# Patient Record
Sex: Female | Born: 1940 | Race: Black or African American | Hispanic: No | Marital: Married
Health system: Southern US, Community
[De-identification: ages and names within clinical notes are randomized; demographics above are authoritative.]

---

## 1998-05-22 ENCOUNTER — Ambulatory Visit (HOSPITAL_COMMUNITY): Admission: RE | Admit: 1998-05-22 | Discharge: 1998-05-22 | Payer: Self-pay

## 1998-07-31 ENCOUNTER — Inpatient Hospital Stay (HOSPITAL_COMMUNITY): Admission: RE | Admit: 1998-07-31 | Discharge: 1998-08-01 | Payer: Self-pay | Admitting: Neurosurgery

## 1998-08-24 ENCOUNTER — Emergency Department (HOSPITAL_COMMUNITY): Admission: EM | Admit: 1998-08-24 | Discharge: 1998-08-24 | Payer: Self-pay | Admitting: Emergency Medicine

## 1998-08-26 ENCOUNTER — Encounter: Payer: Self-pay | Admitting: Neurosurgery

## 1998-08-26 ENCOUNTER — Inpatient Hospital Stay (HOSPITAL_COMMUNITY): Admission: AD | Admit: 1998-08-26 | Discharge: 1998-08-28 | Payer: Self-pay | Admitting: Neurosurgery

## 1998-10-10 ENCOUNTER — Emergency Department (HOSPITAL_COMMUNITY): Admission: EM | Admit: 1998-10-10 | Discharge: 1998-10-10 | Payer: Self-pay | Admitting: Emergency Medicine

## 1998-10-10 ENCOUNTER — Encounter: Payer: Self-pay | Admitting: Emergency Medicine

## 1999-10-09 ENCOUNTER — Encounter: Admission: RE | Admit: 1999-10-09 | Discharge: 1999-10-09 | Payer: Self-pay

## 2000-02-11 ENCOUNTER — Encounter: Payer: Self-pay | Admitting: Neurosurgery

## 2000-02-11 ENCOUNTER — Encounter: Admission: RE | Admit: 2000-02-11 | Discharge: 2000-02-11 | Payer: Self-pay | Admitting: Neurosurgery

## 2000-02-24 ENCOUNTER — Encounter: Payer: Self-pay | Admitting: Internal Medicine

## 2000-02-24 ENCOUNTER — Encounter: Admission: RE | Admit: 2000-02-24 | Discharge: 2000-02-24 | Payer: Self-pay | Admitting: Internal Medicine

## 2000-03-03 ENCOUNTER — Encounter: Admission: RE | Admit: 2000-03-03 | Discharge: 2000-03-03 | Payer: Self-pay | Admitting: Internal Medicine

## 2000-03-03 ENCOUNTER — Encounter: Payer: Self-pay | Admitting: Internal Medicine

## 2000-03-11 ENCOUNTER — Encounter: Payer: Self-pay | Admitting: General Surgery

## 2000-03-11 ENCOUNTER — Encounter: Admission: RE | Admit: 2000-03-11 | Discharge: 2000-03-11 | Payer: Self-pay | Admitting: General Surgery

## 2000-03-15 ENCOUNTER — Encounter (INDEPENDENT_AMBULATORY_CARE_PROVIDER_SITE_OTHER): Payer: Self-pay | Admitting: *Deleted

## 2000-03-15 ENCOUNTER — Ambulatory Visit (HOSPITAL_BASED_OUTPATIENT_CLINIC_OR_DEPARTMENT_OTHER): Admission: RE | Admit: 2000-03-15 | Discharge: 2000-03-15 | Payer: Self-pay

## 2000-04-15 ENCOUNTER — Encounter: Payer: Self-pay | Admitting: Neurosurgery

## 2000-04-15 ENCOUNTER — Inpatient Hospital Stay (HOSPITAL_COMMUNITY): Admission: RE | Admit: 2000-04-15 | Discharge: 2000-04-18 | Payer: Self-pay | Admitting: Neurosurgery

## 2000-04-16 ENCOUNTER — Encounter: Payer: Self-pay | Admitting: Internal Medicine

## 2000-07-19 ENCOUNTER — Encounter: Admission: RE | Admit: 2000-07-19 | Discharge: 2000-07-19 | Payer: Self-pay | Admitting: Neurosurgery

## 2000-07-19 ENCOUNTER — Encounter: Payer: Self-pay | Admitting: Neurosurgery

## 2000-11-03 ENCOUNTER — Encounter: Payer: Self-pay | Admitting: Neurosurgery

## 2000-11-03 ENCOUNTER — Encounter: Admission: RE | Admit: 2000-11-03 | Discharge: 2000-11-03 | Payer: Self-pay | Admitting: Neurosurgery

## 2001-04-07 ENCOUNTER — Ambulatory Visit (HOSPITAL_COMMUNITY): Admission: RE | Admit: 2001-04-07 | Discharge: 2001-04-07 | Payer: Self-pay | Admitting: Gastroenterology

## 2001-04-25 ENCOUNTER — Encounter: Payer: Self-pay | Admitting: Neurosurgery

## 2001-04-25 ENCOUNTER — Encounter: Admission: RE | Admit: 2001-04-25 | Discharge: 2001-04-25 | Payer: Self-pay | Admitting: Neurosurgery

## 2001-08-01 ENCOUNTER — Encounter: Payer: Self-pay | Admitting: Internal Medicine

## 2001-08-01 ENCOUNTER — Encounter: Admission: RE | Admit: 2001-08-01 | Discharge: 2001-08-01 | Payer: Self-pay | Admitting: Internal Medicine

## 2001-10-10 ENCOUNTER — Observation Stay (HOSPITAL_COMMUNITY): Admission: EM | Admit: 2001-10-10 | Discharge: 2001-10-11 | Payer: Self-pay | Admitting: Emergency Medicine

## 2001-10-10 ENCOUNTER — Encounter: Payer: Self-pay | Admitting: Internal Medicine

## 2001-10-11 ENCOUNTER — Encounter: Payer: Self-pay | Admitting: Interventional Cardiology

## 2002-03-29 ENCOUNTER — Encounter: Admission: RE | Admit: 2002-03-29 | Discharge: 2002-03-29 | Payer: Self-pay | Admitting: Internal Medicine

## 2002-03-29 ENCOUNTER — Encounter: Payer: Self-pay | Admitting: Internal Medicine

## 2002-08-02 ENCOUNTER — Encounter: Payer: Self-pay | Admitting: Internal Medicine

## 2002-08-02 ENCOUNTER — Encounter: Admission: RE | Admit: 2002-08-02 | Discharge: 2002-08-02 | Payer: Self-pay | Admitting: Internal Medicine

## 2003-08-01 ENCOUNTER — Emergency Department (HOSPITAL_COMMUNITY): Admission: EM | Admit: 2003-08-01 | Discharge: 2003-08-01 | Payer: Self-pay

## 2003-08-01 ENCOUNTER — Encounter: Payer: Self-pay | Admitting: Emergency Medicine

## 2003-09-07 ENCOUNTER — Encounter: Payer: Self-pay | Admitting: Internal Medicine

## 2003-09-07 ENCOUNTER — Encounter: Admission: RE | Admit: 2003-09-07 | Discharge: 2003-09-07 | Payer: Self-pay | Admitting: Internal Medicine

## 2003-12-07 ENCOUNTER — Encounter: Admission: RE | Admit: 2003-12-07 | Discharge: 2003-12-07 | Payer: Self-pay | Admitting: Neurosurgery

## 2004-02-21 ENCOUNTER — Inpatient Hospital Stay (HOSPITAL_COMMUNITY): Admission: RE | Admit: 2004-02-21 | Discharge: 2004-02-25 | Payer: Self-pay | Admitting: Neurosurgery

## 2004-09-29 ENCOUNTER — Encounter: Admission: RE | Admit: 2004-09-29 | Discharge: 2004-09-29 | Payer: Self-pay | Admitting: Internal Medicine

## 2005-10-01 ENCOUNTER — Encounter: Admission: RE | Admit: 2005-10-01 | Discharge: 2005-10-01 | Payer: Self-pay | Admitting: Internal Medicine

## 2006-03-24 ENCOUNTER — Encounter: Payer: Self-pay | Admitting: Neurosurgery

## 2006-10-04 ENCOUNTER — Encounter: Admission: RE | Admit: 2006-10-04 | Discharge: 2006-10-04 | Payer: Self-pay | Admitting: Internal Medicine

## 2006-10-18 ENCOUNTER — Emergency Department (HOSPITAL_COMMUNITY): Admission: EM | Admit: 2006-10-18 | Discharge: 2006-10-18 | Payer: Self-pay | Admitting: Emergency Medicine

## 2007-07-11 ENCOUNTER — Encounter (HOSPITAL_BASED_OUTPATIENT_CLINIC_OR_DEPARTMENT_OTHER): Admission: RE | Admit: 2007-07-11 | Discharge: 2007-07-28 | Payer: Self-pay | Admitting: Surgery

## 2007-10-06 ENCOUNTER — Encounter: Admission: RE | Admit: 2007-10-06 | Discharge: 2007-10-06 | Payer: Self-pay | Admitting: Internal Medicine

## 2007-10-13 ENCOUNTER — Encounter: Admission: RE | Admit: 2007-10-13 | Discharge: 2007-10-13 | Payer: Self-pay | Admitting: Internal Medicine

## 2007-10-21 ENCOUNTER — Encounter: Admission: RE | Admit: 2007-10-21 | Discharge: 2007-10-21 | Payer: Self-pay | Admitting: Internal Medicine

## 2008-10-03 ENCOUNTER — Encounter: Admission: RE | Admit: 2008-10-03 | Discharge: 2008-10-03 | Payer: Self-pay | Admitting: Internal Medicine

## 2009-10-11 ENCOUNTER — Encounter: Admission: RE | Admit: 2009-10-11 | Discharge: 2009-10-11 | Payer: Self-pay | Admitting: Internal Medicine

## 2009-12-01 ENCOUNTER — Emergency Department (HOSPITAL_COMMUNITY): Admission: EM | Admit: 2009-12-01 | Discharge: 2009-12-01 | Payer: Self-pay | Admitting: Emergency Medicine

## 2010-09-03 ENCOUNTER — Encounter: Admission: RE | Admit: 2010-09-03 | Discharge: 2010-09-03 | Payer: Self-pay | Admitting: Orthopedic Surgery

## 2010-10-13 ENCOUNTER — Encounter: Admission: RE | Admit: 2010-10-13 | Discharge: 2010-10-13 | Payer: Self-pay | Admitting: Internal Medicine

## 2010-11-02 ENCOUNTER — Encounter
Admission: RE | Admit: 2010-11-02 | Discharge: 2010-11-02 | Payer: Self-pay | Source: Home / Self Care | Attending: Neurosurgery | Admitting: Neurosurgery

## 2011-02-08 LAB — URINALYSIS, ROUTINE W REFLEX MICROSCOPIC
Ketones, ur: NEGATIVE mg/dL
Leukocytes, UA: NEGATIVE
Nitrite: NEGATIVE
Protein, ur: 100 mg/dL — AB

## 2011-02-08 LAB — DIFFERENTIAL
Basophils Absolute: 0 10*3/uL (ref 0.0–0.1)
Eosinophils Absolute: 0.1 10*3/uL (ref 0.0–0.7)
Lymphocytes Relative: 14 % (ref 12–46)
Neutro Abs: 2.1 10*3/uL (ref 1.7–7.7)
Neutrophils Relative %: 81 % — ABNORMAL HIGH (ref 43–77)

## 2011-02-08 LAB — BASIC METABOLIC PANEL
BUN: 19 mg/dL (ref 6–23)
Calcium: 9 mg/dL (ref 8.4–10.5)
Creatinine, Ser: 1.12 mg/dL (ref 0.4–1.2)
GFR calc non Af Amer: 49 mL/min — ABNORMAL LOW (ref >60)
Glucose, Bld: 186 mg/dL — ABNORMAL HIGH (ref 70–99)

## 2011-02-08 LAB — CBC
HCT: 32.3 % — ABNORMAL LOW (ref 36.0–46.0)
Platelets: 130 10*3/uL — ABNORMAL LOW (ref 150–400)
RDW: 19.8 % — ABNORMAL HIGH (ref 11.5–15.5)

## 2011-02-08 LAB — URINE MICROSCOPIC-ADD ON

## 2011-02-08 LAB — GLUCOSE, CAPILLARY: Glucose-Capillary: 171 mg/dL — ABNORMAL HIGH (ref 70–99)

## 2011-04-07 NOTE — Consult Note (Signed)
[name], Frazier                ACCOUNT NO.:  [id number]   MEDICAL RECORD NO.:  [id number]          PATIENT TYPE:  REC   LOCATION:  FOOT                         FACILITY:  [facility]   PHYSICIAN:  Theresia Majors. Tanda Rockers, M.D.DATE OF BIRTH:  Feb 18, 1941   DATE OF CONSULTATION:  07/13/2007  DATE OF DISCHARGE:                                 CONSULTATION   SUBJECTIVE:  Teresa Frazier is a 70 year old lady referred by Dr. Feliciana Rossetti  and Benard Rink, PA for evaluation of an ulceration on her buttocks.   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   RECOMMENDATIONS:  A punch biopsy was performed in the wound center in  the interim we have instructed the patient to use an antiseptic soap  wash daily, apply a dry dressing.  We will reevaluate her in 3 days and  reassess the treatment plan based on the final pathology.   SUBJECTIVE:  Teresa Frazier is a 70 year old lady who has a longstanding  history of rheumatoid arthritis and has been under the care of Dr. Phylliss Bob  and Dr. Shary Decamp and Benard Rink PA for several years.  She has had flare-  ups of pustules in the inner gluteal area for the past 2-3 years.  These  are not associated with fever.  They have never been associated with  malodor.  They are somewhat irritating but they have never been  incapacitating.  The current episode has been active for the last 2  months.  She continues to be ambulatory.   PAST MEDICAL HISTORY:  Remarkable for allergies to penicillin.   CURRENT MEDICATION:  1. Benicar 40 mg daily.  2. Clonidine 0.3 mg t.i.d.  3. Amlodipine 5 mg daily.  4. Spirolactone 25 mg daily.  5. KCl 10 mEq daily.  6. Omeprazole 20 mg daily.  7. Glipizide 10 mg b.i.d.  8. Prednisone 5 mg daily.  9. Folic acid 2 mg daily.  10.Lasix 40 mg b.i.d.  11.Estradiol 1 mg daily.  12.Metformin 500 mg b.i.d.  13.Januvia 100 mg one daily.  14.Methotrexate 10 mg daily.  15.Simvastatin 20 mg daily.  16.Lyrica 25 mg daily.  17.Levemir 10 units at q.h.s. and 5 units a.m.  18.Vitamins daily.  19.Hydrocodone 10/650 b.i.d. p.r.n.  20.Leflunomide 20 mg one daily.  21.Ferrous sulfate 25 mg b.i.d.   PAST SURGERIES:  1. Lumbar laminectomy.  2. Cholecystectomy.  3. Hysterectomy.   MEDICAL PROBLEMS:  1. Hypertension.  2. Reflux esophagitis.  3. Rheumatoid arthritis.  4. Asthma.  5. Diverticulosis.   FAMILY HISTORY:  Positive for hypertension, heart attack, stroke.   SOCIAL HISTORY:  Socially, she is medically retired.  She lives with her  husband.  They have two children who are adults and live in the local  area.   REVIEW OF SYSTEMS:  The patient reports that she has a tendency to  retain fluid, and this is attributed to her continued use of steroids  for control of her rheumatoid symptoms.  Her diabetes has been under  good control.  She specifically denies hemoptysis, dyspnea on  exertion,  heat or cold intolerance.  Her weight has been stable.  There are no  stigmata of TIAs.  There are no visual changes.  The remainder of the  review of systems is negative.   PHYSICAL EXAMINATION:  GENERAL:  She is an alert, oriented female in  good contact with reality, responding appropriately.  VITAL SIGNS:  Blood pressure is 128/73, respirations are 16, pulse rate  85, temperature 98.1.  HEENT exam is clear.  NECK:  Supple.  Trachea is midline.  Thyroid is nonpalpable.  LUNGS:  Clear.  HEART:  Sounds are distant.  ABDOMEN:  Soft.  She has a prominent panniculus.  There is bilateral 2+  edema.  The pedal pulses are indeterminate, but the capillary refill is  brisk.  Protective sensation remains intact.  Inspection of the sacral  area shows a halo of indurated tissue which is nontender.  There are  varying stages of blister formation with ulceration.  There are no  abscesses.  There is no is associated cellulitis.  There is no malodor  and there is no  appreciable drainage.  The central area has what appears  to be healthy granulation with advancing periphery of epithelium.  The  wound was cleansed with Betadine and alcohol, anesthetized with 1%  Xylocaine and a punch biopsy was taken through an area of transition  into the area of frank ulceration.  Hemorrhage was controlled with  direct pressure and the patient tolerated this well.   DISCUSSION:  The patient is a multiple drugs, history of rheumatoid  arthritis is suggestive of drug reaction.  There has been no history of  forced supine positioning which would predispose to a sacral decubitus,  and this wound does not resemble classic stasis ulceration.  We  therefore proceeded with the punch biopsy and we will formulate her  treatment plan based on the return of the final pathology.   We have explained this approach to the patient in terms that she seems  to understand.  She has informed me that she will be seeing Dr. Phylliss Bob  tomorrow.  We have encouraged her to discuss this consultation with Dr.  Phylliss Bob and make  him aware of the dictation which will be in the system.  We will  reevaluate the patient in 3 days.  She seems to understand and indicates  that she will be compliant as per above.  She expresses gratitude for  having been seen in the wound center.      Harold A. Tanda Rockers, M.D.  Electronically Signed     HAN/MEDQ  D:  07/13/2007  T:  07/14/2007  Job:  [id number]   cc:   Feliciana Rossetti, MD  Benard Rink, PA  Areatha Keas, M.D.

## 2011-04-07 NOTE — Assessment & Plan Note (Signed)
[facility]   NAMEHAILEA, EAGLIN                ACCOUNT NO.:  [id number]   MEDICAL RECORD NO.:  [id number]      DATE OF BIRTH:  09-10-1941   PHYSICIAN:  Theresia Majors. Tanda Rockers, M.D. VISIT DATE:  07/18/2007                                   OFFICE VISIT   SUBJECTIVE:  Ms. Dicke is a 70 year old lady who is seen in follow-up  for sacral ulcerations of questionable etiology.  In the interim, the  patient has had punch biopsies.  She returns for follow-up having  cleansed the areas daily with antiseptic soap wash.  There has been no  interim fever or malodorous drainage.   OBJECTIVE:  Blood pressure is 133/73, respirations 16, pulse rate 92,  temperature 97.1.  Capillary blood glucose is 110.  Inspection of the  sacral area shows that the ulcers are essentially unchanged.  They are  clean.  There is no hyperemia.  Granulation is persistent with evidence  of advancing epithelium.  There is no malodor whatsoever.  Review of the  pathology shows a spongiotic and psoriaform dermatitis.   ASSESSMENT:  Histologically confirmed dermatitis per biopsy.   PLAN:  We are referring the patient to Dr. Dorinda Hill for  evaluation.  We will photocopy the path report and have the patient take  this along with her consultation.  We are discharging the patient from  active follow-up in the Wound Center.  We have indicated that we will be  happy to reevaluate her if desired by either Dr. Phylliss Bob or Dr. Romeo Apple  post consultation.  The patient was given an opportunity to ask  questions.  She seems to understand the plan and expresses gratitude for  having been seen in the clinic  In the interim, the patient will  continue antiseptic soap washes daily.      Harold A. Tanda Rockers, M.D.  Electronically Signed     HAN/MEDQ  D:  07/18/2007  T:  07/19/2007  Job:  [id number]   cc:   Areatha Keas, M.D.  Dr. Dorinda Hill

## 2011-04-10 NOTE — Discharge Summary (Signed)
[place]  Patient:    Teresa Frazier, Teresa Frazier                       MRN: [id number] Adm. Date:  [id number] Disc. Date: [id number] Attending:  Colon Branch                           Discharge Summary  DIAGNOSIS:  L5-S1 unstable spondylolisthesis with foraminal stenosis and right radiculopathy.  DISCHARGE DIAGNOSIS:  L5-S1 unstable spondylolisthesis with foraminal stenosis and right radiculopathy.  PROCEDURE:  Decompressive laminectomy, L5-S1, with posterior lumbar interbody fusion, L5-S1, with ______ cages, pedicle screw fixation L5 to S1, autograft open reduction and internal fixation of spondylolisthesis, and posterolateral fusion, L5-S1.  HISTORY OF PRESENT ILLNESS:  Patient is a 70 year old woman who had a right 5-1 diskectomy done a couple years ago who, over the last few months prior to admission, was having a worse and worse low back pain radiating into the right lower extremity.   It happens when she is standing too long or walking, even if she sits for a prolonged period of time.  No left-sided symptoms were concluded on MRI and a myelogram, both of these done in the prone position, and x-ray fairly normal but some degeneration at 5-1 with central bulge, far lateral restenosis.  Standing films done during the myelogram revealed the 5-1 was slipped.  Then, flexion and extension lumbar spine films were then done showing the instability at 5-1.  Patient was admitted for decompression and fusion.  HOSPITAL COURSE:  Patient was admitted the day of surgery and underwent the procedure named above without complications.  Postoperatively, patient was transferred to the recovery room and then to the floor.  The afternoon after surgery, she was moving her legs well and had some minimal low back pain.  Due to her history of diabetes and hypertension, internal medicine consultation was obtained and they followed the patient through her hospital stay.   The first postoperative day, she had a slight increase of temperature but she was moving lower extremities well and started getting up with her brace on.  She did have some moderate incisional drainage but that decreased over the next couple of days.  She started ambulating and doing well with a lot less leg pain from preoperatively and minimal to moderate low back pain.  She had a moderate postoperative ileus.  This resolved in the next couple days and she was discharged home in stable condition, Apr 18, 2000.  Diabetes was well controlled.  DISCHARGE MEDICATIONS:  Same as prehospitalization plus: 1. Percocet p.r.n. pain. 2. Robaxin p.r.n. spasms.  ACTIVITY:  Patient to be up with brace but no strenuous activity.  FOLLOW-UP:  Follow up in my office in 10 days or so for follow-up check. DD:  06/01/00 TD:  06/02/00 Job: [id number]

## 2011-04-10 NOTE — Op Note (Signed)
Teresa Frazier, Teresa Frazier                          ACCOUNT NO.:  [id number]   MEDICAL RECORD NO.:  [id number]                   PATIENT TYPE:  INP   LOCATION:  2899                                 FACILITY:  [facility]   PHYSICIAN:  Clydene Fake, M.D.               DATE OF BIRTH:  1941/09/25   DATE OF PROCEDURE:  02/21/2004  DATE OF DISCHARGE:                                 OPERATIVE REPORT   PREOPERATIVE DIAGNOSES:  Unstable spondylolisthesis at L4-5 with stenosis,  spondylosis, prior surgery and fusion at L5-S1 with instrumentation.   POSTOPERATIVE DIAGNOSES:  Unstable spondylolisthesis at L4-5 with stenosis,  spondylosis, prior surgery and fusion at L5-S1 with instrumentation.   PROCEDURE:  Redo GILL decompression laminectomy at L4-5, posterior lumbar  interbody fusion at L4-5 with Brantigan interbody cages at L4-5, removal of  S1 pedicle screws and previous rod and then non segmental pedicle screw  fixation with Moss-Miami system at L4-5, posterolateral fusion L4-5 with all  fusion done with autograph through the same incision and BMP was put in the  cages  and posterolaterally.   SURGEON:  Clydene Fake, M.D.   FIRST ASSISTANT:  Danae Orleans. Venetia Maxon, M.D.   ANESTHESIA:  General endotracheal tube anesthesia.   ESTIMATED BLOOD LOSS:  600 cc.   BLOOD GIVEN:  200 cc of Cell Saver given.   REASON FOR PROCEDURE:  The patient is a 70 year old woman who underwent an  L5-S1 fusion in 2001 who has done well until she fell off a counter at home  putting up curtains and has had severe back and right-sided leg pain since.  She had x-rays that were done showing unstable spondylolisthesis at the  level above the fusion at L4-5.  The patient was brought for decompression  and fusion.   PROCEDURE IN DETAIL:  The patient was brought into the operating room.  General anesthesia was induced.  The patient was placed in the prone  position on the Wilson frame with all pressure points padded.  The  patient  was prepped and draped in a sterile fashion.  The intended incision was  injected with 20 cc of 1% lidocaine with epinephrine.  The incision was then  made at the site of previous incision in the midline. The lower lumbosacral  spine incision was made further cephalad.  The incision was taken down to  the fascia and hemostasis was obtained with Bovie cauterization.  The fascia  was incised over the last spinous process that we could feel and  subperiosteal dissection was done there at the L4 lamina.  We dissected up  and subperiosteal dissection over the L3 lamina and came down over the  laminectomy defect from the prior surgery.  We dissected out towards the  facets, found the pedicle screws at L5 and S1 and exposed them and then  found the transverse process of L4 and exposed that.  We carefully dissected  the bottom of L4, the lamina up on the scar and then started removing the L4  spinous process and the lamina with Leksell rongeurs and Kerrison punches  and high-speed drill.  All bone was saved, cleaned from soft tissue and  chopped into small pieces and used later in the case.  Facetectomies were  performed and removal of the pars was done which decompressed the thecal sac  as we were dissecting.  The ligament was severely thickened associated with  probably a synovial cyst and synovial tissue pushed out into the thecal sac  bilaterally and scarred down and we carefully peeled this off the dural,  decompressing the nerve structures.  We had this done and removed bone all  the way to the pedicle at both 4 and 5, decompressing the lateral gutters.  The disk spaces were found bilaterally.  Hemostasis obtained with bipolar  cauterization.  The disk spaces were incised and a diskectomy was performed  with pituitary rongeurs and we then used the distractors to distract the  interspace at the L4-5 up to 11 mm using the various broaches to clean up  the interspaces along with  pituitary rongeurs and then used the final broach  for an 11 high x 9 wide Brantigan cage, removing cartilaginous endplates at  both areas.  When we were finished, we had a good diskectomy, good  decompression of the central canal and the 4 and 5 nerve roots.  The 11 high  x 9 wide Brantigan cages were then packed with BMP, ws infused into the  sponge and some autograft was placed around that into the cage.  We packed  the interspace with autograft bone, packed the cage into place on one side  to hold the distraction on the other, removed the distraction on the other  side and put more autograft bone into the interspace and then tapped a  second cage into place.  We also packed with the BMP and autograft bone.  A  Gelfoam was placed over the nerve roots and attention was taken to the L4  pedicle entry point.  The transverse process was decorticated bilaterally  and entry point decorticated and __________  placed down the pedicle, first  on the left and then on the right using fluoroscopy as a guide.  The pedicle  was tapped and then a 6 x 45 Moss-Miami screw was placed in each hole.  We  decorticated what was left of the pars and then the facets at L5 and removed  the locking nuts of the L5 and S1 screws bilaterally, removed the rod.  We  explored the fusion at L5-S1.  Its preoperative studies showed we had a good  fusion.  There was no motion intraoperatively and the S1 screws were  removed.  We again decorticated the lateral facet at L5 and we placed BMP  in the sponge with autograft bone rolled into the posterolateral gutters for  posterolateral fusion at L4-5 bilaterally.  The rod was then placed into the  screw heads at L4-5 and the inner and outer nuts were tightened down with  some compression placed over the interspace.  Finally, AP and lateral  fluoroscopic images were obtained showing good position of pedicle screws, rods and cages.  The left screw did look a little lateral but we  tested the  hole after it was tapped with probes, showing that it was __________  all  the way around.  We left this in place.  We then  explored the dura and nerve  roots and the nerve roots were all decompressed bilaterally and we placed a  small piece of Gelfoam over the dura and __________  .  The retractors were  removed.  The paraspinous muscles were closed with 0 Vicryl interrupted  suture.  The fascia was closed with 0 Vicryl interrupted suture and the  subcutaneous tissue was closed with 2-0 and 3-0 Vicryl interrupted suture.  The skin was closed with Benzoin and Steri-Strips.  A dressing was placed.  The patient was placed back in a supine position with local dressing,  transferred to the recovery room in stable condition.                                               Clydene Fake, M.D.    JRH/MEDQ  D:  02/21/2004  T:  02/21/2004  Job:  [id number]

## 2011-04-10 NOTE — H&P (Signed)
[place]  Patient:    Teresa Frazier, Teresa Frazier                       MRN: [id number] Adm. Date:  [id number] Attending:  Colon Branch                         History and Physical  CHIEF COMPLAINT:  Back and right lower extremity pain.  HISTORY OF PRESENT ILLNESS:  Status post right 5-1 diskectomy a couple of years ago, who over the last month has been getting worse and worse low back pain radiating down to the right lower extremity.  The pain happens when she stands or walks, even sits sometimes, but if she lays down she can get the resolution of things.  No left-sided symptoms.  Work-up included MRI myelogram for which all the pictures were done in the prone position.  Essentially normal with degeneration at 5-1, with central bulge, and far lateral recess stenosis, but then in standing films we noticed that the 5-1 has slipped into a grade 1 spondylolisthesis.  Flexion and extension films then delineated the instability.  The patient admitted for decompression and fusion.  PAST MEDICAL HISTORY:  Significant for hypertension, diabetes, arthritis.  MEDICATIONS:  Premarin, Glucotrol, Norvasc, hydrochlorothiazide, methotrexate, prednisone.  ALLERGIES:  No drug allergies.  PREVIOUS OPERATIONS:  ___________ surgery in 1985; breast biopsy in 2001; previous right 5-1 diskectomy a couple of years ago.  SOCIAL HISTORY:  The patient is married.  Does not smoke, or drink alcohol.  FAMILY HISTORY:  Noncontributory.  REVIEW OF SYSTEMS:  Otherwise negative.  PHYSICAL EXAMINATION:  GENERAL:  The patient is very pleasant.  HEENT:  Unremarkable.  NECK:  Supple.  LUNGS:  Clear.  HEART:  Regular rate and rhythm.  ABDOMEN:  Obese.  Soft and nontender.  EXTREMITIES:  Intact.  BACK, NEUROLOGIC:  Range of motion of back is done fairly well.  Decrease in extension, right radicular pain brought on with extension and right lateral bending.  Slight tenderness  to the left side of the spine.  No tenderness over the hip bursa.  Gait is done fairly normally.  Does have a positive straight leg raise on the right.  Negative on the left.  Motor strength is intact in all groups.  Sensation is slightly decreased to pinprick in the right L5 and S1 distributions.  Otherwise intact deep tendon reflexes decreased to both ankles.  ASSESSMENT:  Patient with unstable grade 1 spondylolisthesis at L5-S1 with lateral recess stenosis.  The patient brought in for decompression and fusion of 5-1. DD:  04/15/00 TD:  04/15/00 Job: [id number]

## 2011-04-10 NOTE — Consult Note (Signed)
[place]. [facility]  Patient:    Teresa Frazier, Teresa Frazier Visit Number: [id number] MRN: [id number]          Service Type: Attending:  Meade Maw, M.D. Dictated by:   Meade Maw, M.D.   CC:         Thora Lance, M.D.   Consultation Report  REFERRING PHYSICIAN:  Thora Lance, M.D.  INDICATION FOR CONSULTATION:  Chest pain.  HISTORY OF PRESENT ILLNESS:  Teresa Frazier is a 70 year old female with the following problem list:  1. Atypical chest pain with onset four days prior to admission. 2. Adult onset diabetes mellitus. 3. Hypertension. 4. Reactive airway disease. 5. Obesity. 6. Rheumatoid arthritis since age 44, on chronic prednisone therapy.  Teresa Frazier is a pleasant 70 year old African-American female who first noticed on Thursday a feeling of fatigue and a sensation of feeling drained.  She states that she has not been able to perform her usual activities secondary to the fatigue.  She presented to the ER in [place] and was noted to have a heart rate in the 40s and 50s.  Her Toprol was discontinued, and she was discharged to home.  She felt better on Saturday, then on Sunday evening she felt an episode of substernal chest tightness which persisted for approximately five minutes.  There was radiation down to her left antecubital space associated with diaphoresis but no nausea.  On the day of admission she also noted an episode of substernal chest tightness which radiated to her left antecubital space, again associated with diaphoresis but again no nausea or vomiting.  She denies paroxysmal nocturnal dyspnea, orthopnea, and palpitations.  She has a sedentary lifestyle, has not performed any aggressive exercise in the past week.  Coronary risk factors are significant for advanced age, diabetes, obesity, hypertension, and sedentary activity.  Her lipid profile is unknown.  PAST MEDICAL HISTORY: 1. As noted above and including rosacea. 2. History  of Bells palsy.  PAST SURGICAL HISTORY: 1. Bladder suspension approximately nine years prior. 2. TAH with BSO in 1976. 3. Cholecystectomy in 1975. 4. Lower back surgery. 5. Right breast biopsy.  SOCIAL HISTORY:  She is married, has been married for 27 years.  Has two stepchildren.  Her husband is a smoker.  His health is described as fair.  She worked in the past for [employer].  She has been disabled for the past 12 years secondary to rheumatoid arthritis.  No history of alcohol use.  She stopped smoking five years prior.  FAMILY HISTORY:  Mother passed at 46 from a myocardial infarction.  Father passed at age 6 from poor circulation.  Brother passed at age 41 from a stroke.  This was two months prior.  Brother passed at 61 secondary to traumatic injury, and a brother died _____.  REVIEW OF SYSTEMS:  As noted above.  She has had GERD symptoms, which are described as not in her upper throat.  She has had no difficulty with swallowing.  Activity prior had been limited by motivation.  PHYSICAL EXAMINATION:  VITAL SIGNS:  Her systolic pressure is 130/78, heart rate is 80.  Her O2 saturation is 97% on room air.  She is afebrile.  GENERAL:  She is an obese African-American female in no acute distress.  HEENT:  Unremarkable.  NECK:  She has good carotid upstrokes.  There is no carotid bruit.  Difficult to assess neck veins secondary to neck obesity.  Thyroid is not palpable.  CHEST:  Pulmonary exam  reveals breath sounds which are diminished but clear to auscultation.  CARDIAC:  Normal S1, normal S2, regular rate and rhythm.  Unable to palpate the PMI secondary to her pendulous breasts.  ABDOMEN:  Soft, benign, nontender.  There is no obvious hepatosplenomegaly. Again exam is made difficult by obesity.  There are no unusual bruits or pulsations noted.  EXTREMITIES:  Extremities do not reveal any peripheral edema.  DIAGNOSTIC STUDIES:  Her ECG reveals a normal  sinus rhythm, normal ECG.  There were no acute ischemic changes noted.  LABORATORY DATA:  White count is 7.2, hematocrit is 39, her platelet count is 297.  Her INR is 1.0.  Electrolytes are within normal limits.  Potassium is 4.0, BUN is 11, creatinine is 1.2.  She has normal liver enzymes.  Her initial CK was 169 with a troponin of 0.02.  IMPRESSION: 30. A 70 year old female with multiple risk factors for coronary artery    disease, including remote tobacco use, sedentary activity, obesity, and    hypertension.  We will schedule for a stress Cardiolite if her second set    of enzymes is negative.  There is no evidence of ischemia by ECG criteria. 2. Hypertension.  Blood pressure currently is well-controlled on Cartia 360 mg    p.o. q.d. and p.r.n. Lasix.  Agree with continuation of aspirin and    Lovenox.  Would monitor for bradyarrhythmias while on Lopressor 25 mg p.o.    b.i.d.  Further recommendations will be pending the outcome of the stress Cardiolite. The exam will be made difficult by her pendulous breasts.  Of note, the patient is not on an ACE inhibitor secondary to a previous allergic reaction to an ACE inhibitor described as a throat swelling. Dictated by:   Meade Maw, M.D. Attending:  Meade Maw, M.D. DD:  10/11/01 TD:  10/11/01 Job: [id number]

## 2011-04-10 NOTE — H&P (Signed)
[place]. [facility]  Patient:    Teresa Frazier, Teresa Frazier Visit Number: [id number] MRN: [id number]          Service Type: Attending:  Thora Lance, M.D. Dictated by:   Thora Lance, M.D.                           History and Physical  CHIEF COMPLAINT:  Chest tightness.  HISTORY OF PRESENT ILLNESS:  This is a 70 year old black female with history of diabetes mellitus and hypertension who presents with chest discomfort. Last Thursday, she felt weak and tired.  On Friday, she was so weak that she could barely walk across her home.  She felt short of breath while doing this. She went to the [facility] and was found to have some bradycardia.  Her beta blocker was discontinued.  Her lab work and chest x-ray and EKG were checked.  Last night while watching TV, she had the onset of some retrosternal chest pressure/pain which felt like a "knot."  This was associated with a racing heart and some left arm pain.  It lasted about 20 minutes and then resolved.  This morning while walking around her house, she had chest tightness and shortness of breath.  This resolved when she lay down.  When she came to my office this afternoon, she also felt chest tightness while walking in with shortness of breath.  Again, this resolved with rest.  PAST MEDICAL HISTORY:  1. Rheumatoid arthritis.  2. Reactive airway disease.  3. Diabetes mellitus.  4. Hypertension.  5. Rosacea.  6. Hypertension.  7. History of L5-S1 unstable spondylolisthesis with foraminal stenosis,     status post surgery.  8. Menopause.  9. History of Bells palsy. 10. Loud snoring with negative sleep study for sleep apnea in 1997. 11. Infarcted right breast papilloma.  PAST SURGICAL HISTORY:  1. Bladder suspension about nine years ago.  2. Total abdominal hysterectomy with bilateral salpingo-oophorectomy in 1976     for ovarian cyst.  3. Cholecystectomy in 1979.  4. Right L5-S1  hemilaminectomy/diskectomy/foraminotomy in September 1999,     Dr. Clydene Fake.  5. Decompressive laminectomy with foraminotomies, L5-S1, with posterior     lumbar anterior body fusion, L5-S1, with cages, Dr. Phoebe Perch, May 2001.  6. Right breast biopsy, April 2001.  ALLERGIES:  PENICILLIN -- hives; PRINZIDE -- angioedema.  CURRENT MEDICATIONS:  1. Glucotrol XL 10 mg q.d.  2. Actos 15 mg q.d.  3. Cartia XL 360 mg q.d.  4. Toprol-XL 50 mg q.d.  5. Haldol on November 17th.  6. Avalide 150/12.5 mg two q.d.  7. Prednisone 5 mg q.d.  8. Methotrexate 7.5 mg q.wk.  9. Lasix with potassium 40 mg/20 mEq q.d. p.r.n. 10. Prevacid 30 mg p.r.n. 11. Proventil p.r.n.  FAMILY HISTORY:  Father died at age 65 -- history of hypertension, peripheral vascular disease, strokes, coronary artery disease and heavy smoking.  Mother died at age 49 -- history of multiple strokes, coronary artery disease, hypertension and possibly colon polyps.  Brother diagnosed in late 30s with early stage colon cancer.  Multiple siblings with hypertension.  One sibling -- CVA.  SOCIAL HISTORY:  Married.  She has no children but two stepchildren.  She is on disability.  She quit smoking in 1995.  Rare alcohol.  PHYSICAL EXAMINATION:  GENERAL:  An obese, otherwise well-appearing black female.  VITAL SIGNS:  Blood pressure 142/82, heart rate 88,  temperature is 98.4.  HEENT:  Unremarkable.  NECK:  No bruits.  LUNGS:  Clear.  HEART:  Regular rate and rhythm without murmur, gallop or rub.  ABDOMEN:  Soft, nontender, without mass.  EXTREMITIES:  No edema.  NEUROLOGIC:  Nonfocal.  LABORATORY DATA:  EKG shows normal sinus rhythm and a normal EKG.  ASSESSMENT:  1. Chest pain, rule out unstable angina.  2. Diabetes mellitus.  3. Hypertension.  PLAN:  Admit.  Lovenox.  Aspirin.  Cardiac enzymes.  Cardiology consult. Dictated by:   Thora Lance, M.D. Attending:  Thora Lance, M.D. DD:  10/10/01 TD:   10/11/01 Job: [id number]

## 2011-04-10 NOTE — Discharge Summary (Signed)
Teresa Frazier, Teresa Frazier                          ACCOUNT NO.:  [id number]   MEDICAL RECORD NO.:  [id number]                   PATIENT TYPE:  INP   LOCATION:  3039                                 FACILITY:  [facility]   PHYSICIAN:  Clydene Fake, M.D.               DATE OF BIRTH:  02/16/1941   DATE OF ADMISSION:  02/21/2004  DATE OF DISCHARGE:  02/25/2004                                 DISCHARGE SUMMARY   DIAGNOSIS:  Spondylolisthesis lumbar verterbrae-4/5 with stenosis and  spondylosis and prior fusion.   DISCHARGE DIAGNOSIS:  Spondylolisthesis lumbar vertebrae-4/5 with stenosis  and spondylosis and prior fusion.   PROCEDURE:  Redo guild decompression lumbar verterbrae-4/5 with posterior  lumbar interbody fusion with interbody cages at L4/5, removal of S1 pedicle  screws, nonsegmented ___________ of 4/5 posterior lumbar interbody fusion  with autograft and BNP.   REASON FOR ADMISSION:  The patient is a 70 year old woman who underwent L5-  S1 fusion in 2001 and did well but developed while at home putting up  curtains back pain and right leg pain.  X-rays then showing unstable  spondylolisthesis with a fusion at L4/5 and the patient is brought in for  decompression and fusion of spinal stenosis.   HOSPITAL COURSE:  The patient was admitted to day surgery and underwent the  procedure without complications.  Postoperatively, he was transferred to the  recovery room and then to the floor where she was doing well.  Noted  significant decrease in leg pain.  The incision had minimal drainage.  Started increasing her activity.  PT and OT were consulted to help decrease  activity and work the patient in a brace.  She started ambulating and doing  well again.  Noted no leg pain and minimal back ache.  Did get a fever of  103 but then remained afebrile.  He had slight ileus but had a bowel  movement February 25, 2004.  He was ambulating well.  He had minimal incision  pain.   At this point, feet  were intact and she was afebrile.  She was doing  extremely well and was discharged home in stable condition.   DISCHARGE MEDICATIONS:  Same as prehospitalization.  This includes Percocet  and Flexeril p.r.n. pain.   DISCHARGE INSTRUCTIONS:  Brace when up.  No strenuous activity.                                                Clydene Fake, M.D.    JRH/MEDQ  D:  03/20/2004  T:  03/21/2004  Job:  [id number]

## 2011-04-10 NOTE — Op Note (Signed)
[place]. [facility]  Patient:    Teresa Frazier, Teresa Frazier                       MRN: [id number] Proc. Date: 04/15/00 Adm. Date:  [id number] Disc. Date: [id number] Attending:  Colon Branch                           Operative Report  DIAGNOSIS:  L5-S1 unstable spondylolisthesis with foraminal stenosis and right radiculopathy.  PROCEDURE:  Decompressive laminectomy and foraminotomies L5-S1, posterior lumbar interbody fusion L5-S1 with ______ cages (11 x 11 left side, 11 x 9 wide right side), pedicle screw fixation L5-S1 with Moss-Miami system, open reduction/internal fixation of spondylolisthesis, posterolateral fusion L5-S1, autograft.  SURGEON:  Clydene Fake, M.D.  ASSISTANT:  Izell Islandia. Elesa Hacker, M.D.  ANESTHESIA:  General endotracheal anesthesia.  ESTIMATED BLOOD LOSS:  350 cc.  BLOOD GIVEN:  None.  DRAINS:  None.  COMPLICATIONS:  None.  REASON FOR PROCEDURE:  Patient is a 70 year old woman, who has been having low-back pain with right lower extremity pain and if she stands or walks pain worsens to where she has to lay down and rest.  Pain goes mostly in an S1 type distribution down the right leg, no left-sided symptoms.  Best resolution is when she lays down.  Work-up included a MRI of the lumbar spine, which appeared normal except for some degeneration at the 5-1.  She had a prior right 5-1 diskectomy a couple of years ago and then a myelogram was done, but on standing films, it became apparent that she had unstable spondylolisthesis because she moved out of alignment that when all of the pictures taken prone did not show this.  Standard flexion/extension lumbar spine films all then showed the instability in the movements at the 5-1 level.  MRI also showed foraminal narrowing worse on the right and lateral stenosis there.  Patient admitted and brought in for decompression and fusion.  PROCEDURE IN DETAIL:  Patient brought in the operating room,  general anesthesia was induced.  Patient was placed in a prone position on a Wilson frame with all pressure points padded.  Patient was then prepped and draped in a sterile fashion and the site of incision was injected with 10 cc of 1% lidocaine with epinephrine.  The previous incision was seen, which was going to the 5-1 level and this was opened again.  It was an incision in the midline of the lumbar spine, but I extended both inferiorly and superiorly.  Incision taken down to the fascia and the fascia incised with the Bovie and subperiosteal dissection done over the L4, 5 and S1 spinous process and lamina bilaterally out to the facets.  Dissection was then taken out lateral to the 4-5 facets looking for the transverse process, which was found and then out over the L5-sacrum joint and just lateral to that.  After decompression with localization done by fluoroscopy, decompressive laminectomy was done with L5 removed.  A generous medial facetectomy was bilaterally getting pressure over the L5 roots and then doing foraminotomies over the S1 roots bilaterally. Starting on the left side, epidural space was explored down to the disk space. Epidural hemostasis was obtained with bipolar cauterization.  Disk space incised with 15 blade and diskectomy performed.  A large disk bulge was seen there.  On the right side, there was a pars scar and we carefully dissected the scar out following  the pedicles of S1 and bringing that up to the disk space, incising the disk space and performed diskectomy there.  After this, we found the pedicles of L5 and sacrum using fluoroscopy and then using a drill got down to the cortical bone, used an awl to start entering and then used a ______ probe to enter into the pedicle at L5 and S1 bilaterally.  This was all done under fluoroscopic imagining.  With markers in, we took an A/P image showing good position with the pedicles of all the markers.  Bring the fluoroscopy  back to a lateral imagining, we then removed each marker, capped pedicle and placed Moss-Miami pedicle screws.  In L5, 6 x 45 mm screws were used into the sacrum 7 x 35 mm screws were used in the left side then disk space was distracted using interdisk space spreader and then a rods were cut to size, which were about 4.5 cm in length.  Slight curve placed and the left sided rod was placed and the two pedicles screws and the inner and outer nuts placed to lock that in place and to hold the distraction.  We then prepared the interspace bilaterally for ______ cage insertion using the broaches and reamers.  The bone removed from laminectomy and facetectomies were then chopped up into small pieces, to this Grafton puddy was added and this bone was then packed into the two ______ cages, one 11 x 11 and one 11 x 9 wide on the left side the 11 x 11 cage was then tapped into place under fluoroscopy and this was then repeated on the right side.  We did pack bone anterior and lateral to the cages prior to placing the cages in.  We then explored the nerve roots again making sure there was no bone graft that had displaced and caused pressure and irrigated the area.  Gelfoam was then placed over the disk space and around the nerve roots for epidural hemostasis.   We then loosened up the lower screws in the left pedicle screw system and placed her out on the right side and placed the screws there with a good impression.  We tightened down the inner and outer screws on the rods on both sides.  A/P and lateral fluoroscopic images were then obtained showing good position of the pedicle screws, rods and interbody cages with bone graft.  The rest of the bone graft and plate were then placed posterolaterally over the spinous process and to sacrum bilaterally for posterolateral fusion.  The distraction was for reduced spondylolisthesis.  Retractors were then removed.  Paraspinous muscles were closed with 0 Vicryl  interrupted sutures, fascia closed with the same, subcutaneous tissue closed with 0, 2-0 and 3-0 Vicryl interrupted sutures and skin closed with staples.  Dressing was placed and patient was placed back in  a supine position, awakened from anesthesia and transferred to recovery room. DD:  04/15/00 TD:  04/19/00 Job: [id number]

## 2011-04-10 NOTE — Op Note (Signed)
[place]. [facility]  Patient:    Teresa Frazier, Teresa Frazier                       MRN: [id number] Proc. Date: 03/15/00 Adm. Date:  [id number] Attending:  Henrene Dodge CC:         Anselm Pancoast. Zachery Dakins, M.D.             Dr. Doloris Hall                           Operative Report  PREOPERATIVE DIAGNOSIS:  Intraductal papilloma, right breast, 9 oclock.  POSTOPERATIVE DIAGNOSIS:  Probably intraductal papilloma, right breast, 9 oclock, await final pathology.  OPERATION:  Excision of subareolar duct.  ANESTHESIA:  General.  SURGEON:  Anselm Pancoast. Zachery Dakins, M.D.  HISTORY:  Teresa Frazier is a 70 year old, moderately overweight black female who was referred to me by ______ for evaluation and management of a subareolar probable  little papilloma after ______ had done a ductogram.  The patient stated that she had had kind of serous drainage from her nipple and had been injected.  The area of abnormality appeared to be about 9 oclock position and on physical exam, there were no obvious changes consistent with tumor on a routine mammogram and I recommended we excise this subareolar duct structure with general anesthesia and the patient was in agreement.  She has not had any really drainage since the injection study that was performed approximately a week ago.  DESCRIPTION OF PROCEDURE:  The patient was taken to the operating suite. Induction of general anesthesia and IV had been started and we gave her a gram of Kefzol nd then the breast was prepped with Betadine surgical scrubbing solution and draped in a sterile manner.  I made kind of an areolar incision from approximately 1 oclock to about 5 oclock position, placed skin hooks on the skin edges and kind of elevated the areolar complex from the underlying duct structures and then placed a lacrimal duct probe into the nipple opening and it went out to the kind of deep 9 oclock position.  I then basically  elevated the areola and then transected the duct structures right at the undersurface of the nipple and then replaced the probe, with a second probe that was placed along side of the first, so that when I separated it, I would continue to have a probe in the duct.  We then sort of elevated the central duct system and kind of working towards the lateral 9 oclock position, most of the numerous little bleeders were coagulated for control, a couple were sutured with 4-0 Vicryl, and then we continued to remove a little central area going to about 9 oclock to about a walnut-size.  On palpation of the area, there was nothing that was obviously a big firm mass or anything in the area and I sent the whole contents for permanent pathologic exam, after tagging the actual duct that was involved with a 4-0 Vicryl.  The little wound, I closed it in two layers with 4-0 Vicryl in the deeper and then a 5-0 Monocryl subcuticular and then Benzoin and Steri-Strips on the skin area.  The patient tolerated the procedure nicely, was awakened and sent to the recovery room in a stable postop  condition.  She will be released after a short stay in the recovery room and I will see her in the office on Wednesday  and we will have the permanent path report at that time. DD:  03/15/00 TD:  03/15/00 Job: [id number]

## 2011-04-10 NOTE — H&P (Signed)
Teresa Frazier, Teresa Frazier                          ACCOUNT NO.:  [id number]   MEDICAL RECORD NO.:  [id number]                   PATIENT TYPE:  INP   LOCATION:  2899                                 FACILITY:  [facility]   PHYSICIAN:  Clydene Fake, M.D.               DATE OF BIRTH:  1941/07/19   DATE OF ADMISSION:  02/21/2004  DATE OF DISCHARGE:                                HISTORY & PHYSICAL   CHIEF COMPLAINT:  Back and right leg pain.   HISTORY OF PRESENT ILLNESS:  The patient is a 70 year old woman who has been  through L5-S1 fusion in 2001.  She had done great until October of 2004,  when she fell off the counter trying to put drapes up on the window.  Ever  since then, she has had severe back and hip pain, and leg pain.   X-rays and CT was done to evaluate, showing unstable spondylolisthesis at L4-  L5 at the level above fusion with a good fusion at L5-S1, and severe  spondylitic changes at the L4-L5 level with facet joints that got pulled  apart.  The patient is brought in for decompression and fusion.   PAST MEDICAL HISTORY:  Significant for hypertension, diabetes, rheumatoid  arthritis.   MEDICATIONS:  1. Glucotrol.  2. Actos.  3. Estradiol.  4. Methotrexate.  5. Cartia.  6. Avalide.  7. Toprol.  8. Prednisone.  9. Hydrocodone p.r.n.   ALLERGIES:  Drug allergies include PENICILLIN.   PREVIOUS SURGERIES:  1. Breast biopsy 2001.  2. Right diskectomy in 1999.  3. L5-S1 fusion in 2001.   SOCIAL HISTORY:  The patient is married.  She does not smoke or drink  alcohol.   REVIEW OF SYSTEMS:  Otherwise negative.   FAMILY HISTORY:  Noncontributory.   PHYSICAL EXAMINATION:  HEENT:  Unremarkable.  NECK:  Supple.  LUNGS:  Clear.  HEART:  Regular rhythm.  ABDOMEN:  Soft, nontender.  EXTREMITIES:  Intact.  No edema.  Gait is normal.  There is tenderness of  the right hip area, and __________.  Straight leg raise is negative.  There  is fairly normal motor strength.   Sensation is intact.   ASSESSMENT:  1. The patient with unstable __________ at L4-L5 level above fusion.  2. The patient is admitted for decompression and fusion.                                                Clydene Fake, M.D.    JRH/MEDQ  D:  02/21/2004  T:  02/21/2004  Job:  [id number]

## 2011-09-10 ENCOUNTER — Other Ambulatory Visit: Payer: Self-pay | Admitting: Internal Medicine

## 2011-09-10 DIAGNOSIS — Z1231 Encounter for screening mammogram for malignant neoplasm of breast: Secondary | ICD-10-CM

## 2011-10-23 ENCOUNTER — Ambulatory Visit
Admission: RE | Admit: 2011-10-23 | Discharge: 2011-10-23 | Disposition: A | Discharge status: Home / Self Care | Payer: Medicare Other | Source: Ambulatory Visit | Attending: Internal Medicine | Admitting: Internal Medicine

## 2011-10-23 DIAGNOSIS — Z1231 Encounter for screening mammogram for malignant neoplasm of breast: Secondary | ICD-10-CM

## 2012-09-12 ENCOUNTER — Other Ambulatory Visit: Payer: Self-pay | Admitting: Internal Medicine

## 2012-09-12 DIAGNOSIS — Z1231 Encounter for screening mammogram for malignant neoplasm of breast: Secondary | ICD-10-CM

## 2012-10-24 ENCOUNTER — Ambulatory Visit
Admission: RE | Admit: 2012-10-24 | Discharge: 2012-10-24 | Disposition: A | Discharge status: Home / Self Care | Payer: Medicare Other | Source: Ambulatory Visit | Attending: Internal Medicine | Admitting: Internal Medicine

## 2012-10-24 DIAGNOSIS — Z1231 Encounter for screening mammogram for malignant neoplasm of breast: Secondary | ICD-10-CM

## 2013-09-28 ENCOUNTER — Other Ambulatory Visit: Payer: Self-pay

## 2013-09-28 DIAGNOSIS — Z1231 Encounter for screening mammogram for malignant neoplasm of breast: Secondary | ICD-10-CM

## 2013-10-26 ENCOUNTER — Ambulatory Visit: Payer: Medicare Other

## 2014-02-14 ENCOUNTER — Ambulatory Visit: Payer: Medicare Other

## 2014-02-20 ENCOUNTER — Ambulatory Visit
Admission: RE | Admit: 2014-02-20 | Discharge: 2014-02-20 | Disposition: A | Discharge status: Home / Self Care | Payer: Medicare Other | Source: Ambulatory Visit

## 2014-02-20 DIAGNOSIS — Z1231 Encounter for screening mammogram for malignant neoplasm of breast: Secondary | ICD-10-CM

## 2014-06-28 ENCOUNTER — Other Ambulatory Visit: Payer: Self-pay | Admitting: Internal Medicine

## 2014-06-28 ENCOUNTER — Ambulatory Visit
Admission: RE | Admit: 2014-06-28 | Discharge: 2014-06-28 | Disposition: A | Discharge status: Home / Self Care | Payer: Medicare Other | Source: Ambulatory Visit | Attending: Internal Medicine | Admitting: Internal Medicine

## 2014-06-28 DIAGNOSIS — M25561 Pain in right knee: Secondary | ICD-10-CM

## 2014-09-23 DEATH — deceased

## 2016-03-30 IMAGING — CR DG KNEE 1-2V*R*
2 series · 2 of 2 positions shown · non-contrast
Comparison: None.

CLINICAL DATA: Pain.

EXAM:
RIGHT KNEE - 1-2 VIEW

[t knee ap right]
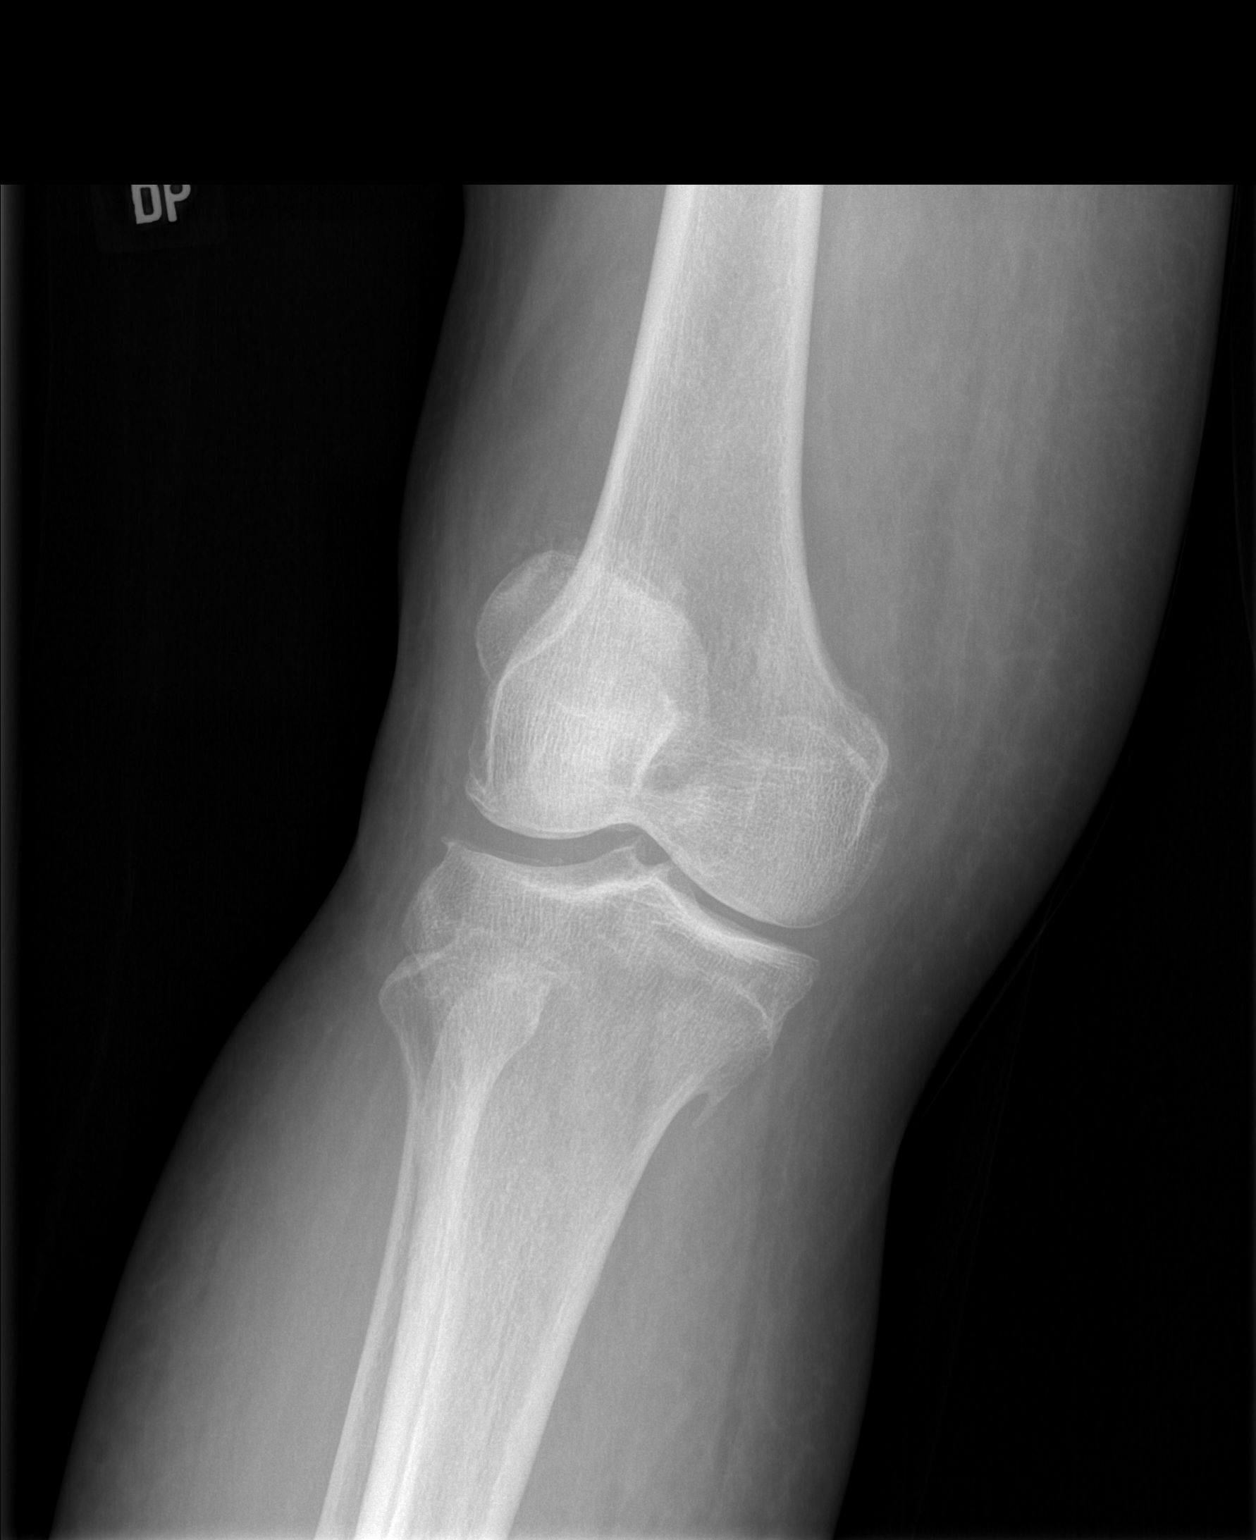

[t knee lat right]
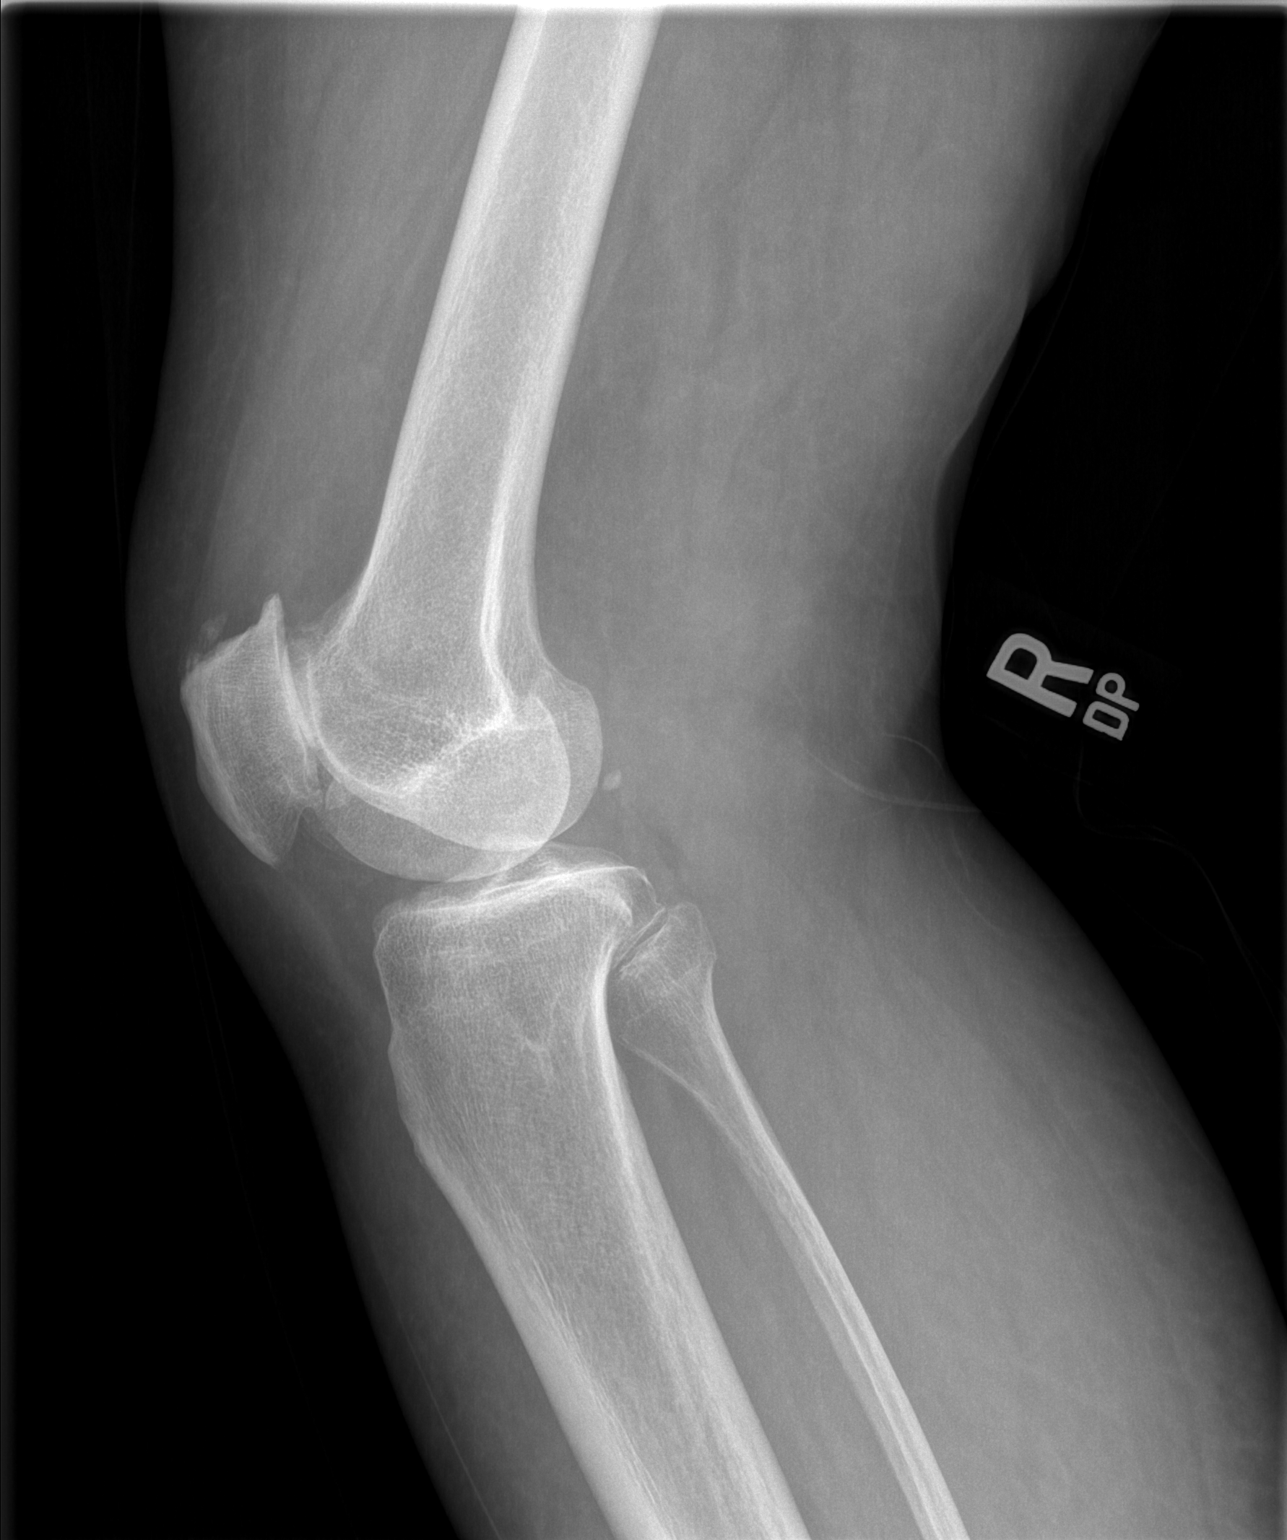

[2 of 2 positions shown; findings below may reference images not displayed]

FINDINGS: Soft tissue structures are unremarkable. Tricompartment degenerative
change present. No evidence of fracture or dislocation. Small loose
bodies may be present. Patellofemoral component of degenerative
changes is particularly severe.
IMPRESSION: Severe diffuse tract of degenerative change. Patellofemoral
degenerative changes uptake is severe. No acute abnormality.
